# Patient Record
Sex: Female | Born: 2000 | Hispanic: Yes | Smoking: Never smoker
Health system: Southern US, Community
[De-identification: ages and names within clinical notes are randomized; demographics above are authoritative.]

## PROBLEM LIST (undated history)

## (undated) DIAGNOSIS — M549 Dorsalgia, unspecified: Secondary | ICD-10-CM

## (undated) DIAGNOSIS — D649 Anemia, unspecified: Secondary | ICD-10-CM

## (undated) HISTORY — DX: Anemia, unspecified: D64.9

## (undated) HISTORY — DX: Dorsalgia, unspecified: M54.9

---
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
## 2022-01-14 ENCOUNTER — Telehealth: Payer: Self-pay

## 2022-01-14 NOTE — Telephone Encounter (Signed)
TC to patient to schedule MH RV. Patient not seen in [place] since 10/31/21. Unable to LM on phone. TC to emergency contact Evorn Gong who states patient has a new phone number. New number [phone]. RN also asked Cecille Aver to let patient know to please call [facility]. He states he will do that. TC to new patient number 2 x. No VM set up and unable to LM. Patient needs appointment asap.Jenetta Downer, RN

## 2022-01-15 ENCOUNTER — Ambulatory Visit: Payer: Medicaid Other | Admitting: Nurse Practitioner

## 2022-01-15 ENCOUNTER — Telehealth: Payer: Self-pay

## 2022-01-15 ENCOUNTER — Encounter: Payer: Self-pay | Admitting: Nurse Practitioner

## 2022-01-15 VITALS — BP 105/70 | HR 100 | Temp 97.8°F | Wt 118.2 lb

## 2022-01-15 DIAGNOSIS — Z23 Encounter for immunization: Secondary | ICD-10-CM

## 2022-01-15 DIAGNOSIS — D649 Anemia, unspecified: Secondary | ICD-10-CM | POA: Insufficient documentation

## 2022-01-15 DIAGNOSIS — O99343 Other mental disorders complicating pregnancy, third trimester: Secondary | ICD-10-CM

## 2022-01-15 DIAGNOSIS — O99013 Anemia complicating pregnancy, third trimester: Secondary | ICD-10-CM

## 2022-01-15 DIAGNOSIS — O09899 Supervision of other high risk pregnancies, unspecified trimester: Secondary | ICD-10-CM

## 2022-01-15 DIAGNOSIS — F32A Depression, unspecified: Secondary | ICD-10-CM

## 2022-01-15 DIAGNOSIS — Z3482 Encounter for supervision of other normal pregnancy, second trimester: Secondary | ICD-10-CM

## 2022-01-15 DIAGNOSIS — O99019 Anemia complicating pregnancy, unspecified trimester: Secondary | ICD-10-CM | POA: Insufficient documentation

## 2022-01-15 DIAGNOSIS — O99340 Other mental disorders complicating pregnancy, unspecified trimester: Secondary | ICD-10-CM

## 2022-01-15 DIAGNOSIS — Z3483 Encounter for supervision of other normal pregnancy, third trimester: Secondary | ICD-10-CM

## 2022-01-15 DIAGNOSIS — Z2839 Other underimmunization status: Secondary | ICD-10-CM

## 2022-01-15 LAB — HEMOGLOBIN, FINGERSTICK: Hemoglobin: 9.2 g/dL — ABNORMAL LOW (ref 11.1–15.9)

## 2022-01-15 MED ORDER — SM PRENATAL VITAMINS 28-0.8 MG PO TABS: 1.0000 | ORAL_TABLET | Freq: Every day | ORAL | 1 refills | Status: DC

## 2022-01-15 MED ORDER — FERROUS SULFATE 325 (65 FE) MG PO TABS: 325.0000 mg | ORAL_TABLET | Freq: Three times a day (TID) | ORAL | 3 refills | Status: AC

## 2022-01-15 NOTE — Progress Notes (Unsigned)
[place] [facility]  PRENATAL VISIT NOTE  Subjective:  Samantha Reyes is a 21 y.o. G2P0010 at 13w1dbeing seen today for ongoing prenatal care.  She is currently monitored for the following issues for this high-risk pregnancy and has Late prenatal care; Supervision of normal intrauterine pregnancy in multigravida, second trimester; Fetal demise before 20 weeks with retention of placenta 06/15/19 and D&C; Depression affecting pregnancy; Maternal varicella, non-immune; and Anemia in pregnancy on their problem list.  Patient reports no complaints.  Contractions: Not present. Vag. Bleeding: None.  Movement: Present. Denies leaking of fluid/ROM.   The following portions of the patient's history were reviewed and updated as appropriate: allergies, current medications, past family history, past medical history, past social history, past surgical history and problem list. Problem list updated.  Objective:   Vitals:   01/15/22 2121  BP: 105/70  Pulse: 100  Temp: 97.8 F (36.6 C)  Weight: 118 lb 3.2 oz (53.6 kg)    Fetal Status: Fetal Heart Rate (bpm): 145 Fundal Height: 33 cm Movement: Present     General:  Alert, oriented and cooperative. Patient is in no acute distress.  Skin: Skin is warm and dry. No rash noted.   Cardiovascular: Normal heart rate noted  Respiratory: Normal respiratory effort, no problems with respiration noted  Abdomen: Soft, gravid, appropriate for gestational age.  Pain/Pressure: Absent     Pelvic: Cervical exam deferred        Extremities: Normal range of motion.  Edema: None  Mental Status: Normal mood and affect. Normal behavior. Normal judgment and thought content.   Assessment and Plan:  Pregnancy: G2P0010 at 338w1d1. Supervision of normal intrauterine pregnancy in multigravida, second trimester -[age]ear old female in clinic today for prenatal care.  Last prenatal visit was 10/31/21.  Patient states she was unaware of next  appointment and was waiting on a phone call to notify of next appointment.  -28 week labs completed today. -Tdap given today. -Reviewed with patient signs and symptoms of preterm labor. -Kick counts given to patient. -U/S 12/10/21 (2960w3dnormal amniotic fluid, EFW 52%, placenta anterior.  -11 lb 3.2 oz (5.08 kg)  -Patient reports that she had a [facility]ment and notified the nutritionist that she has been drinking ensures to assist with weight gain and due to low appetite.  Per [facility]ations patient can continue with ensures and informed patient that ensures can be supplied through [facility] submitted for ensures.   - Hemoglobin, fingerstick - Glucose, 1 hour gestational - HIV-1/HIV-2 Qualitative RNA - RPR - Tdap vaccine greater than or equal to 7yo IM - Fe+CBC/D/Plt+TIBC+Fer+Retic - Prenatal Vit-Fe Fumarate-FA (SM PRENATAL VITAMINS) 28-0.8 MG TABS; Take 1 tablet by mouth daily.  Dispense: 30 tablet; Refill: 1  2. Anemia during pregnancy in third trimester -Hgb today 9.2.  Patient advised to incorporate Iron rich foods and to take Iron 325 MG PO TID separate from PNV with juice.  -[facility]   - ferrous sulfate 325 (65 FE) MG tablet; Take 1 tablet (325 mg total) by mouth 3 (three) times daily with meals.  Dispense: 100 tablet; Refill: 3  3. Depression affecting pregnancy -Patient admits to being sad at times, denies thoughts of self harm. Declines counseling at this time.   4. Maternal varicella, non-immune -Patient notified of results and given information sheet.  Patient advised that she will need a Varicella vaccine at postpartum.  Term labor symptoms and general obstetric precautions including but not limited to vaginal bleeding, contractions, leaking of fluid and fetal movement were reviewed in detail with the patient. Please refer to After Visit Summary for other counseling recommendations.   Return in about  2 weeks (around 01/29/2022) for Routine prenatal care visit.  Future Appointments  Date Time Provider [place]  01/29/2022 11:00 AM [facility] PROVIDER [facility] None    Gregary Cromer, FNP

## 2022-01-15 NOTE — Telephone Encounter (Signed)
Call to patient to let her know that PNV have been sent to her local pharmacy [facility] at [address]   Also to let patient know that Ensure Rx was placed by A. WHite, FNP and that she was to pick that up at [facility].  Al Decant, RN

## 2022-01-15 NOTE — Telephone Encounter (Signed)
No additional notes

## 2022-01-15 NOTE — Telephone Encounter (Signed)
Call to patient, but no answer and patient does not have VM set up.   Please let patient know PNV can be picked up at her local pharmacy at [facility] in [place]   Also let her know provider placed Rx for Ensure and that she can pick that up anytime at [facility].   Al Decant, RN

## 2022-01-15 NOTE — Telephone Encounter (Signed)
Call to client and appt scheduled this am with arrival time of 1045 (1100 appt cancelled as in labor). Rich Number, RN

## 2022-01-15 NOTE — Telephone Encounter (Signed)
Call to patient to let her know PNV has been sent to

## 2022-01-15 NOTE — Progress Notes (Signed)
Hgb reviewed during clinic visit.3  Per standing order: Dispensed ferrous sulfate '325mg'$  dispensed to patient. Instructed to take one tablet times a day with juice that has Vit C such as orange, apple, or grape juice.   Anemia panel order added.   Anemia in pregnancy added to problem list.   Counsel patient on Fe-rich foods and Anemia in Pregnancy Pamphlet given.   Patient verbalized understanding. Questions answered.   Al Decant, RN

## 2022-01-15 NOTE — Progress Notes (Unsigned)
Patient here for [place] RV at 33w 1d.   PTL handout, kick counts, chicken pox in pregnancy education done today. Patient aware she will receive varivax PP.   1 hr gtt done today a long with other 28 weeks labs. Tdap done today - tolerated well. Updated immunization record handed today.   Al Decant

## 2022-01-16 LAB — FE+CBC/D/PLT+TIBC+FER+RETIC
Basophils Absolute: 0 10*3/uL (ref 0.0–0.2)
Basos: 1 %
EOS (ABSOLUTE): 0.1 10*3/uL (ref 0.0–0.4)
Eos: 1 %
Ferritin: 13 ng/mL — ABNORMAL LOW (ref 15–77)
Hematocrit: 30 % — ABNORMAL LOW (ref 34.0–46.6)
Hemoglobin: 9.1 g/dL — ABNORMAL LOW (ref 11.1–15.9)
Immature Grans (Abs): 0.1 10*3/uL (ref 0.0–0.1)
Immature Granulocytes: 1 %
Iron Saturation: 5 % — CL (ref 15–55)
Iron: 32 ug/dL (ref 27–159)
Lymphocytes Absolute: 1.5 10*3/uL (ref 0.7–3.1)
Lymphs: 24 %
MCH: 25 pg — ABNORMAL LOW (ref 26.6–33.0)
MCHC: 30.3 g/dL — ABNORMAL LOW (ref 31.5–35.7)
MCV: 82 fL (ref 79–97)
Monocytes Absolute: 0.4 10*3/uL (ref 0.1–0.9)
Monocytes: 7 %
Neutrophils Absolute: 4.2 10*3/uL (ref 1.4–7.0)
Neutrophils: 66 %
Platelets: 230 10*3/uL (ref 150–450)
RBC: 3.64 x10E6/uL — ABNORMAL LOW (ref 3.77–5.28)
RDW: 13.6 % (ref 11.7–15.4)
Retic Ct Pct: 2.8 % — ABNORMAL HIGH (ref 0.6–2.6)
Total Iron Binding Capacity: 697 ug/dL (ref 250–450)
UIBC: 665 ug/dL — ABNORMAL HIGH (ref 131–425)
WBC: 6.2 10*3/uL (ref 3.4–10.8)

## 2022-01-16 NOTE — Telephone Encounter (Signed)
Telephone call to patient today regarding information about her PNV and Ensure.  Voicemail not set up.  MyChart message sent to patient today regarding her PNV and Ensure.  Dahlia Bailiff, RN

## 2022-01-17 LAB — HIV-1/HIV-2 QUALITATIVE RNA
HIV-1 RNA, Qualitative: NONREACTIVE
HIV-2 RNA, Qualitative: NONREACTIVE

## 2022-01-17 LAB — RPR: RPR Ser Ql: NONREACTIVE

## 2022-01-17 LAB — GLUCOSE, 1 HOUR GESTATIONAL: Gestational Diabetes Screen: 125 mg/dL (ref 70–139)

## 2022-01-17 NOTE — Telephone Encounter (Signed)
Telephone call to patient today regarding some information patient needs.  Voicemail not set up.  Telephone call to patient contact/Friend Evorn Gong)  [phone].  Cecille Aver will have patient call the maternity nurses at [phone].  Dahlia Bailiff, RN

## 2022-01-17 NOTE — Telephone Encounter (Signed)
Patient sent MyChart message stating she received the message and will pick up both the PNV and Ensure. Dahlia Bailiff, RN

## 2022-01-29 ENCOUNTER — Ambulatory Visit: Payer: Medicaid Other

## 2022-01-29 DIAGNOSIS — Z2839 Other underimmunization status: Secondary | ICD-10-CM

## 2022-01-29 NOTE — Addendum Note (Signed)
Addended by: Cletis Media on: 01/29/2022 12:38 PM   Modules accepted: Orders

## 2022-01-30 ENCOUNTER — Telehealth: Payer: Self-pay

## 2022-01-30 NOTE — Telephone Encounter (Signed)
TC to patient to reschedule missed MH RV yesterday. Unable to LM, no voicemail set up. TC to patient emergency contact, unable to LM, no voicemail set up. Jenetta Downer, RN

## 2022-02-03 NOTE — Telephone Encounter (Signed)
TC to patient to reschedule missed MH RV. Unable to LM, no VM set up.Jenetta Downer, RN

## 2022-02-05 NOTE — Telephone Encounter (Signed)
TC to patient to reschedule missed MH RV on 01/29/22. Patient now scheduled for 02/06/22 at 10:00, counseled to arrive 9:45. Jenetta Downer, RN

## 2022-02-06 ENCOUNTER — Telehealth: Payer: Self-pay

## 2022-02-06 ENCOUNTER — Ambulatory Visit: Payer: Medicaid Other | Admitting: Advanced Practice Midwife

## 2022-02-06 VITALS — BP 105/65 | HR 89 | Temp 97.4°F | Wt 122.6 lb

## 2022-02-06 DIAGNOSIS — F32A Depression, unspecified: Secondary | ICD-10-CM

## 2022-02-06 DIAGNOSIS — O99343 Other mental disorders complicating pregnancy, third trimester: Secondary | ICD-10-CM

## 2022-02-06 DIAGNOSIS — F5089 Other specified eating disorder: Secondary | ICD-10-CM | POA: Insufficient documentation

## 2022-02-06 DIAGNOSIS — Z91199 Patient's noncompliance with other medical treatment and regimen due to unspecified reason: Secondary | ICD-10-CM

## 2022-02-06 DIAGNOSIS — Z3483 Encounter for supervision of other normal pregnancy, third trimester: Secondary | ICD-10-CM

## 2022-02-06 DIAGNOSIS — O99340 Other mental disorders complicating pregnancy, unspecified trimester: Secondary | ICD-10-CM

## 2022-02-06 DIAGNOSIS — O0932 Supervision of pregnancy with insufficient antenatal care, second trimester: Secondary | ICD-10-CM

## 2022-02-06 DIAGNOSIS — O99013 Anemia complicating pregnancy, third trimester: Secondary | ICD-10-CM

## 2022-02-06 DIAGNOSIS — O0930 Supervision of pregnancy with insufficient antenatal care, unspecified trimester: Secondary | ICD-10-CM

## 2022-02-06 DIAGNOSIS — Z3482 Encounter for supervision of other normal pregnancy, second trimester: Secondary | ICD-10-CM

## 2022-02-06 DIAGNOSIS — O021 Missed abortion: Secondary | ICD-10-CM

## 2022-02-06 LAB — HEMOGLOBIN, FINGERSTICK: Hemoglobin: 8.7 g/dL — ABNORMAL LOW (ref 11.1–15.9)

## 2022-02-06 NOTE — Telephone Encounter (Signed)
Called  [place] at [phone] to speak to someone there to have patient's IV infusion scheduled.   Per scheduler, she states that there indeed is on order for an IV infusion, however, they have no availability prior to EDD of 02/28/22. She recommended I call [facility] to see if I can schedule an infusion sooner.   RN then called [facility] and they transferred to [facility] same day admissions to have make an appointment for patient.   [facility] admissions phone number is [phone] and spoke with [place]. Per Elmo Putt in order to have patient scheduled, they need a attending MD, referring doctor and and diagnosis into order to place admission order. Could not go any further with this process.   RN then  again called [facility] at [phone] ext [phone] to speak to an RN. Spoke with Rosemarie Ax, RN to let her know of situation. Per Rosemarie Ax she states she will send a message to provider who spoke with patient and placed IV infusion order, [employer], [place].   Ree Shay phone number [phone] to given [facility] maternity nurse an update.   RN also helped Earlene with sending a mychart message to [employer], AGNP regarding need to have iron IV infusion scheduled before due date of 03/04/22.   Patient aware of all of this and she states she will answer our call when me call her for an update today.   Al Decant, RN

## 2022-02-06 NOTE — Progress Notes (Signed)
[place] [facility]  PRENATAL VISIT NOTE  Subjective:  Samantha Reyes is a 21 y.o. G3P0010 at 88w2dbeing seen today for ongoing prenatal care.  She is currently monitored for the following issues for this low-risk pregnancy and has Late prenatal care 18 2/7; Supervision of normal intrauterine pregnancy in multigravida, second trimester; Fetal demise before 20 weeks with retention of placenta 06/15/19 and D&C; Depression affecting pregnancy; Maternal varicella, non-immune; Anemia in pregnancy; Noncompliance with prenatal care (no care x 11 wks); and Pica ice on their problem list.  Patient reports no complaints.  Contractions: Not present. Vag. Bleeding: None.  Movement: Present. Denies leaking of fluid/ROM.   The following portions of the patient's history were reviewed and updated as appropriate: allergies, current medications, past family history, past medical history, past social history, past surgical history and problem list. Problem list updated.  Objective:   Vitals:   02/06/22 1004  BP: 105/65  Pulse: 89  Temp: (!) 97.4 F (36.3 C)  Weight: 122 lb 9.6 oz (55.6 kg)    Fetal Status: Fetal Heart Rate (bpm): 145 Fundal Height: 37 cm Movement: Present  Presentation: Vertex  General:  Alert, oriented and cooperative. Patient is in no acute distress.  Skin: Skin is warm and dry. No rash noted.   Cardiovascular: Normal heart rate noted  Respiratory: Normal respiratory effort, no problems with respiration noted  Abdomen: Soft, gravid, appropriate for gestational age.  Pain/Pressure: Absent     Pelvic: Cervical exam deferred        Extremities: Normal range of motion.  Edema: None  Mental Status: Normal mood and affect. Normal behavior. Normal judgment and thought content.   Assessment and Plan:  Pregnancy: G3P0010 at 327w2d1. 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 Late prenatal care 18 2/7   3. Anemia during pregnancy in third trimester Ice pica Not taking FeSo4 daily, maybe 4x/wk with oj Hgb=8.7 today [facility] with plan for IV iron infusion 1000 mg x1 now but never scheduled E. Ramos, RN [facility] and next available apt is after pt delivers; she called scheduler and was told pt needs to be admitted in order to receive IV iron soon, but then was told in order to do this she needs an attending order for admission and for infusion. Pt saw a NP at 01/21/22 apt. She left a message for that NP to call her back to discuss  - Hemoglobin, venipuncture  4. Depression affecting pregnancy PHQ-9=6 Declines need for counseling  5. Fetal demise before 20 weeks with retention of placenta 06/15/19 and D&C   6. Noncompliance with prenatal care (no care x 11 wks)   7. Pica ice Counseled not to eat   Preterm labor symptoms and general obstetric precautions including but not limited to vaginal bleeding, contractions, leaking of fluid and fetal movement were reviewed in detail with the patient. Please refer to After Visit Summary for other counseling recommendations.  Return in about 1 week (around 02/13/2022) for routine PNC.  No future appointments.  ElHerbie SaxonCNM

## 2022-02-06 NOTE — Telephone Encounter (Signed)
Received a call from [facility] and spoke with Rubin Payor, [place]. She states the FNP, Merla Riches, that patient saw on 01/21/22 is out of town.   Per Cassie, she states that as of 2-3 months ago [facility] admissions no longer does IN infusions. She states she will add an expedient order to the IV iron infusion to hopefully see her before due date of 02/28/22. She states [facility] will give patient a call with an appointment.   Patient was made aware of this and encouraged to pick up call from [facility] to have her scheduled.   Al Decant, RN

## 2022-02-06 NOTE — Progress Notes (Signed)
Hgb reviewed with provider during clinic visit. Patient educated on the importance of taking iron TID daily, that her hgb is now even lower.   Patient aware there is a need of IV iron infusion and in the process of getting that scheduled with [facility] ASAP (see previous note).   Al Decant, RN

## 2022-02-10 LAB — CULTURE, BETA STREP (GROUP B ONLY): Strep Gp B Culture: NEGATIVE

## 2022-02-10 LAB — CHLAMYDIA/GC NAA, CONFIRMATION
Chlamydia trachomatis, NAA: NEGATIVE
Neisseria gonorrhoeae, NAA: NEGATIVE

## 2022-02-14 ENCOUNTER — Ambulatory Visit: Payer: Medicaid Other | Admitting: Nurse Practitioner

## 2022-02-14 ENCOUNTER — Encounter: Payer: Self-pay | Admitting: Nurse Practitioner

## 2022-02-14 VITALS — BP 98/67 | HR 105 | Temp 97.5°F | Wt 122.8 lb

## 2022-02-14 DIAGNOSIS — F32A Depression, unspecified: Secondary | ICD-10-CM

## 2022-02-14 DIAGNOSIS — F5089 Other specified eating disorder: Secondary | ICD-10-CM

## 2022-02-14 DIAGNOSIS — Z3483 Encounter for supervision of other normal pregnancy, third trimester: Secondary | ICD-10-CM

## 2022-02-14 DIAGNOSIS — O99340 Other mental disorders complicating pregnancy, unspecified trimester: Secondary | ICD-10-CM

## 2022-02-14 DIAGNOSIS — O99019 Anemia complicating pregnancy, unspecified trimester: Secondary | ICD-10-CM

## 2022-02-14 NOTE — Progress Notes (Signed)
[place] [facility]  PRENATAL VISIT NOTE  Subjective:  Samantha Reyes is a 21 y.o. G3P0010 at 35w3dbeing seen today for ongoing prenatal care.  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  Patient reports no complaints.  Contractions: Not present. Vag. Bleeding: None.  Movement: Present. Denies leaking of fluid/ROM.   The following portions of the patient's history were reviewed and updated as appropriate: allergies, current medications, past family history, past medical history, past social history, past surgical history and problem list. Problem list updated.  Objective:   Vitals:   02/14/22 1000  BP: 98/67  Pulse: (!) 105  Temp: (!) 97.5 F (36.4 C)  Weight: 122 lb 12.8 oz (55.7 kg)    Fetal Status: Fetal Heart Rate (bpm): 160 Fundal Height: 37 cm Movement: Present  Presentation: Vertex  General:  Alert, oriented and cooperative. Patient is in no acute distress.  Skin: Skin is warm and dry. No rash noted.   Cardiovascular: Normal heart rate noted  Respiratory: Normal respiratory effort, no problems with respiration noted  Abdomen: Soft, gravid, appropriate for gestational age.  Pain/Pressure: Absent     Pelvic: Cervical exam deferred        Extremities: Normal range of motion.  Edema: None  Mental Status: Normal mood and affect. Normal behavior. Normal judgment and thought content.   Assessment and Plan:  Pregnancy: G3P0010 at 371w3d1. Supervision of normal intrauterine pregnancy in multigravida in third trimester -[age]ear old female in clinic  today for prenatal care. -Patient taking PNV daily. -ROS reviewed, no complaints. -15 lb 12.8 oz (7.167 kg)   2. Depression affecting pregnancy -Patient denies signs and symptoms of depression.  Overall doing well.    3. Antepartum anemia -Last Hgb 8.7 on 02/06/22.  Patient had an appointment with Hematology on 02/10/22.  Patient given Iron infusion at that visit, tolerated well.  Will check Iron level in 2 weeks.  Encouraged to continue to take iron daily and to incorporate iron rich foods.  Also provided education surrounding the impact that anemia has on pregnancy and delivery.   4. Pica ice -Denies eating ice.    Term labor symptoms and general obstetric precautions including but not limited to vaginal bleeding, contractions, leaking of fluid and fetal movement were reviewed in detail with the patient. Please refer to After Visit Summary for other counseling recommendations.   Return in about 1 week (around 02/21/2022) for Routine prenatal care visit.  Future Appointments  Date Time Provider [place]/27/2023  2:20 PM AC-MH PROVIDER AC-MAT None    AyGregary CromerFNP

## 2022-02-14 NOTE — Progress Notes (Signed)
Patient here for MH RV at 37 3/7. Had [facility] iron infusion on 02/10/2022. States she was counseled at [facility] video appointment to take iron once daily as per patient, iron makes her nauseous.Jenetta Downer, RN

## 2022-02-21 ENCOUNTER — Ambulatory Visit: Payer: Medicaid Other | Admitting: Advanced Practice Midwife

## 2022-02-21 VITALS — BP 103/72 | HR 96 | Temp 97.4°F | Wt 124.0 lb

## 2022-02-21 DIAGNOSIS — Z3483 Encounter for supervision of other normal pregnancy, third trimester: Secondary | ICD-10-CM

## 2022-02-21 DIAGNOSIS — O99343 Other mental disorders complicating pregnancy, third trimester: Secondary | ICD-10-CM

## 2022-02-21 DIAGNOSIS — O99013 Anemia complicating pregnancy, third trimester: Secondary | ICD-10-CM

## 2022-02-21 DIAGNOSIS — O99019 Anemia complicating pregnancy, unspecified trimester: Secondary | ICD-10-CM

## 2022-02-21 DIAGNOSIS — Z3482 Encounter for supervision of other normal pregnancy, second trimester: Secondary | ICD-10-CM

## 2022-02-21 DIAGNOSIS — F32A Depression, unspecified: Secondary | ICD-10-CM

## 2022-02-21 DIAGNOSIS — O0930 Supervision of pregnancy with insufficient antenatal care, unspecified trimester: Secondary | ICD-10-CM

## 2022-02-21 DIAGNOSIS — O99340 Other mental disorders complicating pregnancy, unspecified trimester: Secondary | ICD-10-CM

## 2022-02-21 DIAGNOSIS — F5089 Other specified eating disorder: Secondary | ICD-10-CM

## 2022-02-21 DIAGNOSIS — O0933 Supervision of pregnancy with insufficient antenatal care, third trimester: Secondary | ICD-10-CM

## 2022-02-21 NOTE — Progress Notes (Signed)
[place] [facility]  PRENATAL VISIT NOTE  Subjective:  Samantha Reyes is a 21 y.o. G3P0010 at 57w3dbeing seen today for ongoing prenatal care.  She is currently monitored for the following issues for this low-risk pregnancy and has Late prenatal care 18 2/7; Supervision of normal intrauterine pregnancy in multigravida, second trimester; Fetal demise before 20 weeks with retention of placenta 06/15/19 and D&C; Depression affecting pregnancy; Maternal varicella, non-immune; Anemia in pregnancy; Noncompliance with prenatal care (no care x 11 wks); and Pica ice on their problem list.  Patient reports no complaints.  Contractions: Not present. Vag. Bleeding: None.  Movement: Present. Denies leaking of fluid/ROM.   The following portions of the patient's history were reviewed and updated as appropriate: allergies, current medications, past family history, past medical history, past social history, past surgical history and problem list. Problem list updated.  Objective:   Vitals:   02/21/22 1446  BP: 103/72  Pulse: 96  Temp: (!) 97.4 F (36.3 C)  Weight: 124 lb (56.2 kg)    Fetal Status: Fetal Heart Rate (bpm): 140 Fundal Height: 39 cm Movement: Present  Presentation: Vertex  General:  Alert, oriented and cooperative. Patient is in no acute distress.  Skin: Skin is warm and dry. No rash noted.   Cardiovascular: Normal heart rate noted  Respiratory: Normal respiratory effort, no problems with respiration noted  Abdomen: Soft, gravid, appropriate for gestational age.  Pain/Pressure: Absent     Pelvic: Cervical exam deferred        Extremities: Normal range of motion.  Edema: None  Mental Status: Normal mood and affect. Normal behavior. Normal judgment and thought content.   Assessment and Plan:  Pregnancy: G3P0010 at 325w3d1. Supervision of normal intrauterine pregnancy in multigravida, second trimester 17 lb (7.711 kg) Here with her mom Has  car seat, crib Knows when to go to L&D Not exercising--encouraged to do so 4x/wk Last day of work was 02/14/22  2. Antepartum anemia Taking FeSo4 I daily with water--counseled to take with oj Not eating ice anymore To check Hgb next week  3. Late prenatal care 18 2/7   4. Pica ice Denies eating now that not working  5. Depression affecting pregnancy Denies need for counseling    Preterm labor symptoms and general obstetric precautions including but not limited to vaginal bleeding, contractions, leaking of fluid and fetal movement were reviewed in detail with the patient. Please refer to After Visit Summary for other counseling recommendations.  Return in about 1 week (around 02/28/2022) for routine PNC.  No future appointments.  ElHerbie SaxonCNM

## 2022-02-21 NOTE — Progress Notes (Addendum)
Taking PNV in am and iron tablet in afternoon. Encouraged to take iron tablet with Vitamin C beverage. Kept 02/10/2322 iron infusion appt at [facility]. Verified has [facility] contact card. Counseled on importance of selecting a pediatrician - verified has resource list. Rich Number, RN 1 week [facility] RV appt scheduled for Thursday as an overbook (EGA today = 38 3/7) and reminder card given to client. Rich Number, RN

## 2022-02-27 ENCOUNTER — Encounter: Payer: Self-pay | Admitting: Physician Assistant

## 2022-02-27 ENCOUNTER — Ambulatory Visit: Payer: Medicaid Other | Admitting: Physician Assistant

## 2022-02-27 VITALS — BP 100/65 | HR 102 | Temp 97.5°F | Wt 125.6 lb

## 2022-02-27 DIAGNOSIS — O99019 Anemia complicating pregnancy, unspecified trimester: Secondary | ICD-10-CM

## 2022-02-27 DIAGNOSIS — O99340 Other mental disorders complicating pregnancy, unspecified trimester: Secondary | ICD-10-CM

## 2022-02-27 DIAGNOSIS — F32A Depression, unspecified: Secondary | ICD-10-CM

## 2022-02-27 DIAGNOSIS — Z3483 Encounter for supervision of other normal pregnancy, third trimester: Secondary | ICD-10-CM

## 2022-02-27 LAB — HEMOGLOBIN, FINGERSTICK: Hemoglobin: 10.7 g/dL — ABNORMAL LOW (ref 11.1–15.9)

## 2022-02-27 NOTE — Progress Notes (Signed)
Patient here for MH RV at [redacted]w[redacted]d   Hgb reviewed during clinic visit. Patient to continue taking iron as prescribed.   Made patient an appointment to return tomorrow at 2pm for membrane sweep only per provider request.   EAl Decant RN

## 2022-02-27 NOTE — Addendum Note (Signed)
Addended by: Cletis Media on: 02/27/2022 11:59 AM   Modules accepted: Orders

## 2022-02-27 NOTE — Progress Notes (Signed)
PRENATAL VISIT NOTE  Subjective:  Samantha Reyes is a 21 y.o. G3P0010 at 55w2dbeing seen today for ongoing prenatal care.  She is currently monitored for the following issues for this high-risk pregnancy and has Late prenatal care 18 2/7; Supervision of normal intrauterine pregnancy in multigravida in third trimester; Fetal demise before 20 weeks with retention of placenta 06/15/19 and D&C; Depression affecting pregnancy; Maternal varicella, non-immune; Anemia in pregnancy; Noncompliance with prenatal care (no care x 11 wks); and Pica ice on their problem list.  Patient reports  she lost her mucus plug several days ago .  Contractions: Not present. Vag. Bleeding: None.  Movement: Present. Requests membrane sweep. Had L&D eval 02/25/22 after several hours of contractions, was told she was 3.5cm dilated. Contractions discontinued and she was discharged home.Denies leaking of fluid/ROM.   The following portions of the patient's history were reviewed and updated as appropriate: allergies, current medications, past family history, past medical history, past social history, past surgical history and problem list. Problem list updated.  Objective:   Vitals:   02/27/22 1407  BP: 100/65  Pulse: (!) 102  Temp: (!) 97.5 F (36.4 C)  Weight: 125 lb 9.6 oz (57 kg)    Fetal Status: Fetal Heart Rate (bpm): 152 Fundal Height: 39 cm Movement: Present  Presentation: Vertex  General:  Alert, oriented and cooperative. Patient is in no acute distress.  Skin: Skin is warm and dry. No rash noted.   Cardiovascular: Normal heart rate noted  Respiratory: Normal respiratory effort, no problems with respiration noted  Abdomen: Soft, gravid, appropriate for gestational age.  Pain/Pressure: Absent     Pelvic: Cervical exam performed Dilation: 3 Effacement (%): 50 Station: -3  Extremities: Normal range of motion.  Edema: None  Mental Status: Normal mood and affect. Normal behavior. Normal judgment and thought  content.   Assessment and Plan:  Pregnancy: G3P0010 at 348w2d1. Supervision of normal intrauterine pregnancy in multigravida in third trimester Doing well. Membranes swept at pt request, unable to reach left side. Pt tolerated procedure well .To return for repeat membrane sweeping tomorrow. Discussed option for IOL at 41-42 wks - pt declines. Continue weekly visits. - Hemoglobin, fingerstick  2. Antepartum anemia Completed iron infusion. Anemia improved at 10.7 g/dL today. Continue oral iron. - Hemoglobin, fingerstick  3. Depression affecting pregnancy Denies depressive sx at present, will continue to monitor.   Term labor symptoms and general obstetric precautions including but not limited to vaginal bleeding, contractions, leaking of fluid and fetal movement were reviewed in detail with the patient. Please refer to After Visit Summary for other counseling recommendations.  Return in about 1 week (around 03/06/2022) for Routine prenatal care.  Future Appointments  Date Time Provider [place]11/06/2021  2:00 PM AC-MH PROVIDER AC-MAT None  03/06/2022  1:40 PM AC-MH PROVIDER AC-MAT None    AnLora HavensPA-C

## 2022-02-28 ENCOUNTER — Ambulatory Visit: Payer: Medicaid Other | Admitting: Advanced Practice Midwife

## 2022-02-28 VITALS — BP 114/74 | HR 92 | Temp 98.6°F | Wt 125.8 lb

## 2022-02-28 DIAGNOSIS — F32A Depression, unspecified: Secondary | ICD-10-CM

## 2022-02-28 DIAGNOSIS — O99019 Anemia complicating pregnancy, unspecified trimester: Secondary | ICD-10-CM

## 2022-02-28 DIAGNOSIS — F5089 Other specified eating disorder: Secondary | ICD-10-CM

## 2022-02-28 DIAGNOSIS — O0930 Supervision of pregnancy with insufficient antenatal care, unspecified trimester: Secondary | ICD-10-CM

## 2022-02-28 DIAGNOSIS — Z91199 Patient's noncompliance with other medical treatment and regimen due to unspecified reason: Secondary | ICD-10-CM

## 2022-02-28 DIAGNOSIS — O99340 Other mental disorders complicating pregnancy, unspecified trimester: Secondary | ICD-10-CM

## 2022-02-28 DIAGNOSIS — Z3483 Encounter for supervision of other normal pregnancy, third trimester: Secondary | ICD-10-CM

## 2022-02-28 DIAGNOSIS — O021 Missed abortion: Secondary | ICD-10-CM

## 2022-02-28 NOTE — Progress Notes (Signed)
[place] [facility]  PRENATAL VISIT NOTE  Subjective:  Samantha Reyes is a 21 y.o. G3P0010 at 44w3dbeing seen today for ongoing prenatal care.  She is currently monitored for the following issues for this high-risk pregnancy and has Late prenatal care 18 2/7; Supervision of normal intrauterine pregnancy in multigravida in third trimester; Fetal demise before 20 weeks with retention of placenta 06/15/19 and D&C; Depression affecting pregnancy; Maternal varicella, non-immune; Anemia in pregnancy; Noncompliance with prenatal care (no care x 11 wks); and Pica ice on their problem list.  Patient reports no complaints.  Contractions: Not present. Vag. Bleeding: None.  Movement: Present. Denies leaking of fluid/ROM.   The following portions of the patient's history were reviewed and updated as appropriate: allergies, current medications, past family history, past medical history, past social history, past surgical history and problem list. Problem list updated.  Objective:   Vitals:   02/28/22 1403  BP: 114/74  Pulse: 92  Temp: 98.6 F (37 C)  Weight: 125 lb 12.8 oz (57.1 kg)    Fetal Status: Fetal Heart Rate (bpm): 150   Movement: Present  Presentation: Vertex  General:  Alert, oriented and cooperative. Patient is in no acute distress.  Skin: Skin is warm and dry. No rash noted.   Cardiovascular: Normal heart rate noted  Respiratory: Normal respiratory effort, no problems with respiration noted  Abdomen: Soft, gravid, appropriate for gestational age.  Pain/Pressure: Absent     Pelvic: Cervical exam performed Dilation: 3.5 Effacement (%): 50 Station: -3  Extremities: Normal range of motion.  Edema: None  Mental Status: Normal mood and affect. Normal behavior. Normal judgment and thought content.   Assessment and Plan:  Pregnancy: G3P0010 at 337w3d1. Supervision of normal intrauterine pregnancy in multigravida in third trimester Here for membrane  sweeping today--done easily Has car seat, has [place] Knows when to go to L&D Not working Here with sister in law At yesterday's apt refused IOL; discussed with pt and she now chooses 03/12/22 as IOL date  2. Noncompliance with prenatal care (no care x 11 wks)   3. Antepartum anemia Taking FeSo4 I daily with Ensure--to take with oj  4. Late prenatal care 18 2/7   5. Pica ice Pt denies eating  6. Depression affecting pregnancy Denies need for counseling  7. Fetal demise before 20 weeks with retention of placenta 06/15/19 and D&C    Term labor symptoms and general obstetric precautions including but not limited to vaginal bleeding, contractions, leaking of fluid and fetal movement were reviewed in detail with the patient. Please refer to After Visit Summary for other counseling recommendations.  Return in about 1 week (around 03/07/2022) for routine PNC.  Future Appointments  Date Time Provider [place]11/12/2021  1:40 PM AC-MH PROVIDER AC-MAT None    ElHerbie SaxonCNM

## 2022-02-28 NOTE — Progress Notes (Signed)
Here for membrane sweep only. BThiele RN

## 2022-03-05 ENCOUNTER — Telehealth: Payer: Self-pay

## 2022-03-05 NOTE — Telephone Encounter (Signed)
Called patients phone to give date and time of IOL. Phone does not take voice mail. BThiele RN

## 2022-03-06 ENCOUNTER — Ambulatory Visit: Payer: Medicaid Other | Admitting: Physician Assistant

## 2022-03-06 ENCOUNTER — Encounter: Payer: Self-pay | Admitting: Physician Assistant

## 2022-03-06 VITALS — BP 105/66 | HR 96 | Temp 97.2°F | Wt 126.8 lb

## 2022-03-06 DIAGNOSIS — O480 Post-term pregnancy: Secondary | ICD-10-CM

## 2022-03-06 DIAGNOSIS — Z3483 Encounter for supervision of other normal pregnancy, third trimester: Secondary | ICD-10-CM

## 2022-03-06 DIAGNOSIS — O99013 Anemia complicating pregnancy, third trimester: Secondary | ICD-10-CM

## 2022-03-06 NOTE — Progress Notes (Signed)
[place] [facility]  PRENATAL VISIT NOTE  Subjective:  Samantha Reyes is a 21 y.o. G3P0010 at 78w2dbeing seen today for ongoing prenatal care.  She is currently monitored for the following issues for this low-risk pregnancy and has Late prenatal care 18 2/7; Supervision of normal intrauterine pregnancy in multigravida in third trimester; Fetal demise before 20 weeks with retention of placenta 06/15/19 and D&C; Depression affecting pregnancy; Maternal varicella, non-immune; Anemia in pregnancy; Noncompliance with prenatal care (no care x 11 wks); Pica ice; and Post term pregnancy over 40 weeks on their problem list.  Patient reports  irregular Braxton-Hicks type contractions .  Contractions: Irritability. Vag. Bleeding: None.  Movement: Present. Denies leaking of fluid/ROM.   The following portions of the patient's history were reviewed and updated as appropriate: allergies, current medications, past family history, past medical history, past social history, past surgical history and problem list. Problem list updated.  Objective:   Vitals:   03/06/22 1332 03/06/22 1346  BP: 105/66 105/66  Pulse: 96 96  Temp: (!) 97.2 F (36.2 C) (!) 97.2 F (36.2 C)  Weight: 126 lb 12.8 oz (57.5 kg) 126 lb 12.8 oz (57.5 kg)    Fetal Status: Fetal Heart Rate (bpm): 152 Fundal Height: 39 cm Movement: Present  Presentation: Vertex  General:  Alert, oriented and cooperative. Patient is in no acute distress.  Skin: Skin is warm and dry. No rash noted.   Cardiovascular: Normal heart rate noted  Respiratory: Normal respiratory effort, no problems with respiration noted  Abdomen: Soft, gravid, appropriate for gestational age.  Pain/Pressure: Present     Pelvic: Cervical exam deferred        Extremities: Normal range of motion.  Edema: None  Mental Status: Normal mood and affect. Normal behavior. Normal judgment and thought content.   Assessment and Plan:  Pregnancy:  G3P0010 at 435w2d1. Supervision of normal intrauterine pregnancy in multigravida in third trimester Feels ready for baby, no concerns today.  2. Post term pregnancy over 40 weeks If not delivered, enc to keep NST as sched here 03/11/22 - anticipatory guidance re: procedure given[facility]  3. Anemia during pregnancy in third trimester Continue oral iron supplement.   Term labor symptoms and general obstetric precautions including but not limited to vaginal bleeding, contractions, leaking of fluid and fetal movement were reviewed in detail with the patient. Please refer to After Visit Summary for other counseling recommendations.  Return for 6 wk postpartum.  Future Appointments  Date Time Provider [place]11/14/2023  1:20 PM AC-MH PROVIDER AC-MAT None    AnLora HavensPA-C

## 2022-03-06 NOTE — Telephone Encounter (Signed)
Client aware as kept [facility] RV appt today. Rich Number, RN

## 2022-03-06 NOTE — Progress Notes (Signed)
Pt made aware of IOL appointment on 11/15 and [facility] to call on 11/14 between 11am to 2pm. NST to be scheduled on 11/14 at [place], RN

## 2022-03-10 DIAGNOSIS — Z3483 Encounter for supervision of other normal pregnancy, third trimester: Secondary | ICD-10-CM | POA: Diagnosis not present

## 2022-03-11 ENCOUNTER — Other Ambulatory Visit: Payer: Medicaid Other

## 2022-04-08 ENCOUNTER — Encounter: Payer: Self-pay | Admitting: Emergency Medicine

## 2022-04-08 ENCOUNTER — Emergency Department
Admission: EM | Admit: 2022-04-08 | Discharge: 2022-04-08 | Discharge status: Against Medical Advice | Payer: Medicaid Other | Attending: Emergency Medicine | Admitting: Emergency Medicine

## 2022-04-08 ENCOUNTER — Emergency Department: Payer: Medicaid Other

## 2022-04-08 DIAGNOSIS — Z5321 Procedure and treatment not carried out due to patient leaving prior to being seen by health care provider: Secondary | ICD-10-CM | POA: Diagnosis not present

## 2022-04-08 DIAGNOSIS — R072 Precordial pain: Secondary | ICD-10-CM | POA: Insufficient documentation

## 2022-04-08 DIAGNOSIS — R110 Nausea: Secondary | ICD-10-CM | POA: Insufficient documentation

## 2022-04-08 LAB — BASIC METABOLIC PANEL
Anion gap: 7 (ref 5–15)
BUN: 11 mg/dL (ref 6–20)
CO2: 29 mmol/L (ref 22–32)
Calcium: 9.3 mg/dL (ref 8.9–10.3)
Chloride: 105 mmol/L (ref 98–111)
Creatinine, Ser: 0.51 mg/dL (ref 0.44–1.00)
GFR, Estimated: >60 mL/min (ref >60)
Glucose, Bld: 97 mg/dL (ref 70–99)
Potassium: 3.4 mmol/L — ABNORMAL LOW (ref 3.5–5.1)
Sodium: 141 mmol/L (ref 135–145)

## 2022-04-08 LAB — CBC
HCT: 36.1 % (ref 36.0–46.0)
Hemoglobin: 11.1 g/dL — ABNORMAL LOW (ref 12.0–15.0)
MCH: 26.1 pg (ref 26.0–34.0)
MCHC: 30.7 g/dL (ref 30.0–36.0)
MCV: 84.7 fL (ref 80.0–100.0)
Platelets: 238 10*3/uL (ref 150–400)
RBC: 4.26 MIL/uL (ref 3.87–5.11)
RDW: 19.3 % — ABNORMAL HIGH (ref 11.5–15.5)
WBC: 7.2 10*3/uL (ref 4.0–10.5)
nRBC: 0 % (ref 0.0–0.2)

## 2022-04-08 LAB — TROPONIN I (HIGH SENSITIVITY)
Troponin I (High Sensitivity): <2 ng/L (ref <18)
Troponin I (High Sensitivity): <2 ng/L (ref <18)

## 2022-04-08 NOTE — ED Triage Notes (Signed)
Pt presents via POV with complaints of midsternal CP with associated nausea that started last night. Pt describes the pain as "sharp" pain. She notes taking 2 benadryl PTA, she notes consuming some seafood (allergen) and she believed this was a sx. Airway patent - respirations equal and unlabored. Denies vomiting, diarrhea, SOB.

## 2022-05-30 ENCOUNTER — Telehealth: Payer: Self-pay | Admitting: Family Medicine

## 2022-05-30 NOTE — Telephone Encounter (Signed)
Left voicemail for patient to call the appointment line to schedule her postpartum visit.

## 2022-08-17 IMAGING — US US OB COMP LESS 14 WK
1 series · 13 of 28 positions shown · non-contrast
Comparison: None.

CLINICAL DATA: Pregnant patient. Abdominal pain. Beta HCG level is
128,668

EXAM:
OBSTETRIC <14 WK US
US DOPPLER ULTRASOUND OF OVARIES
TECHNIQUE: Both transabdominal and transvaginal ultrasound examinations were
performed for complete evaluation of the gestation as well as the
maternal uterus, adnexal regions, and pelvic cul-de-sac.
Color and duplex Doppler ultrasound was utilized to evaluate blood
flow to the ovaries.

[Series 1: us ob comp less 14 wks · 13 of 90 slices shown]
[im 4/90]
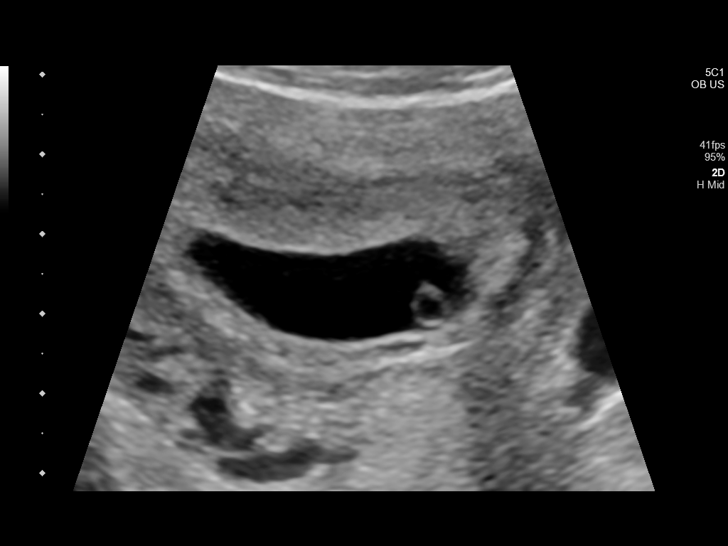
[im 10/90]
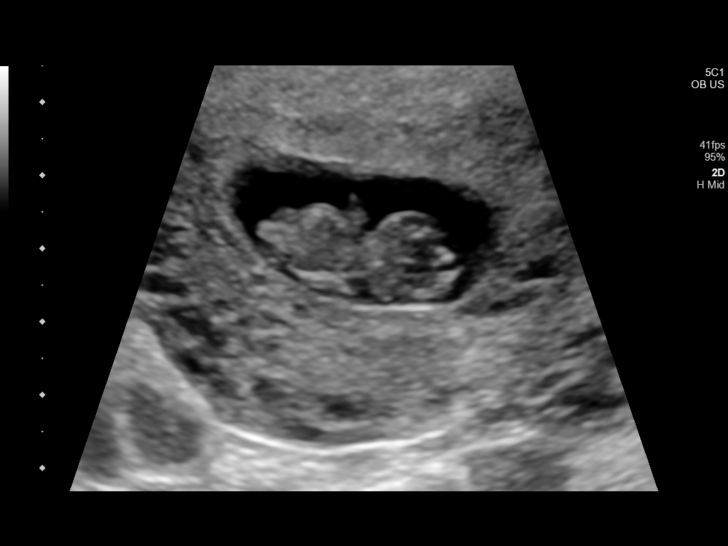
[im 17/90]
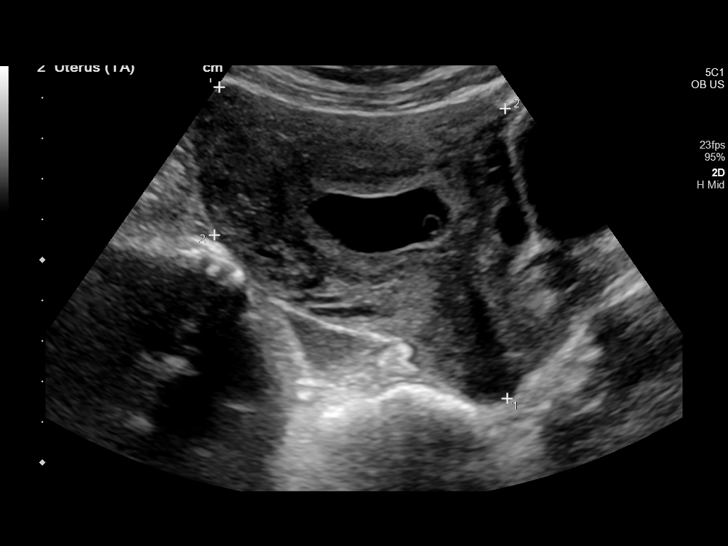
[im 24/90]
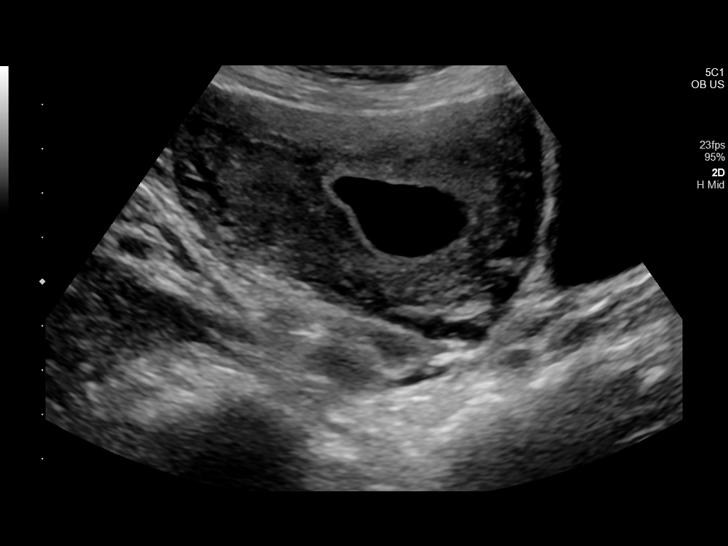
[im 30/90]
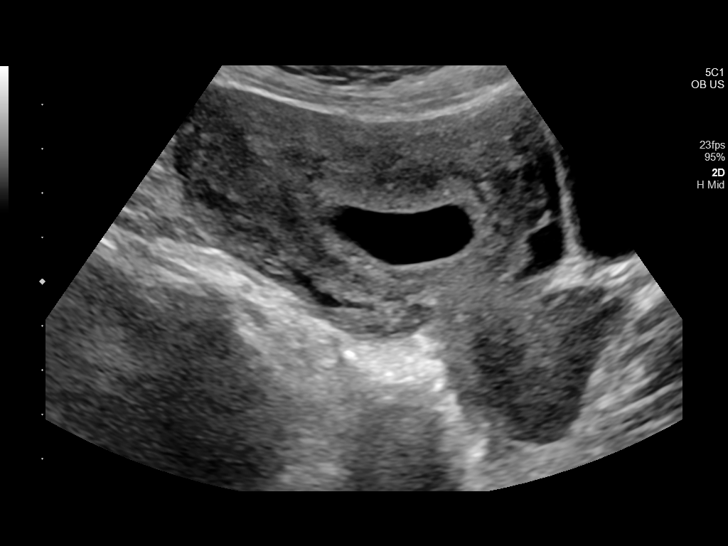
[im 37/90]
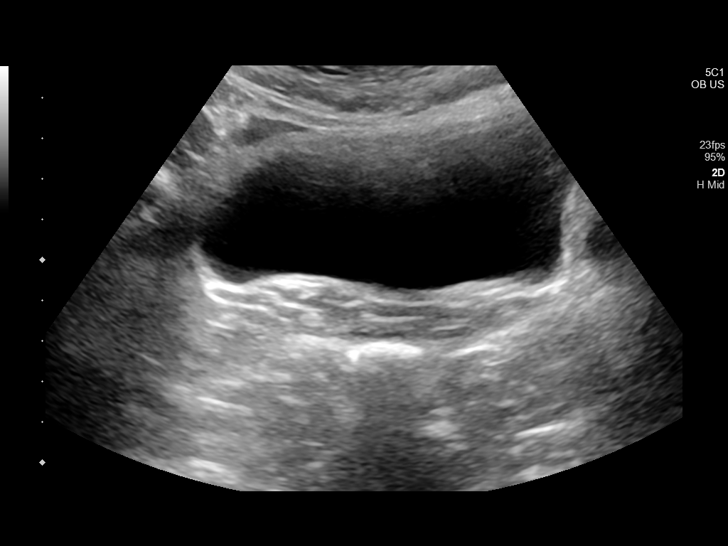
[im 47/90]
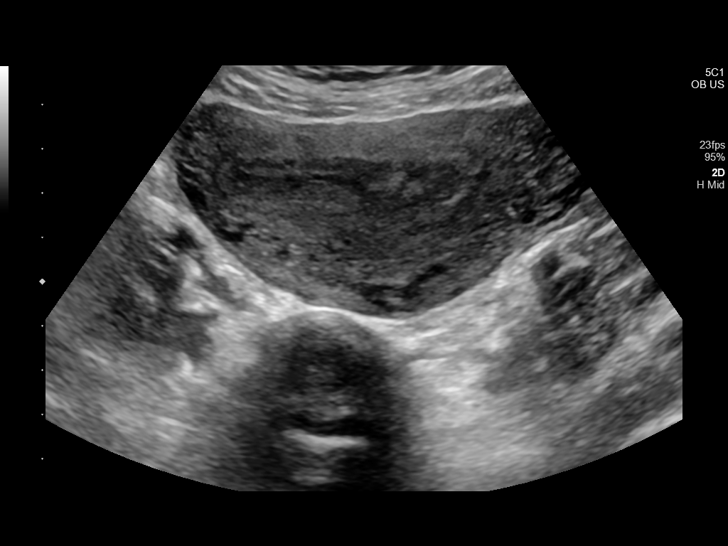
[im 53/90]
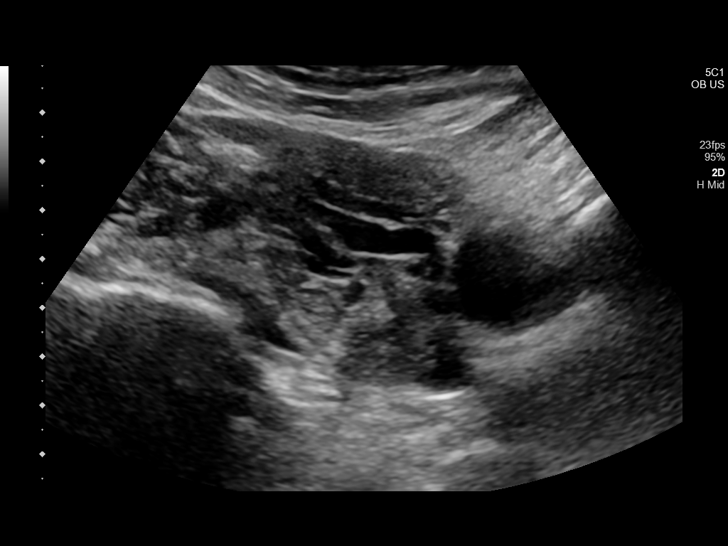
[im 60/90]
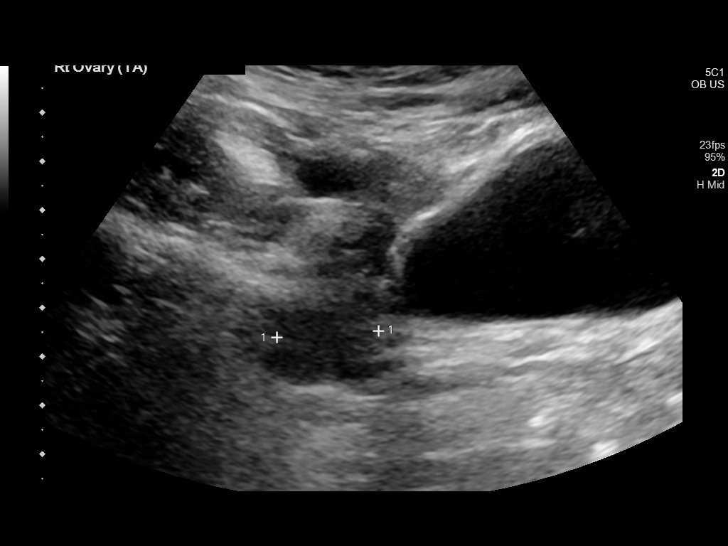
[im 66/90]
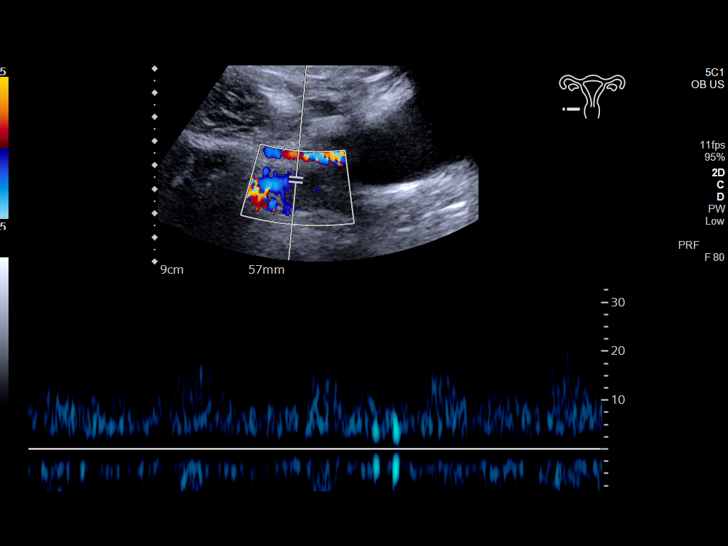
[im 73/90]
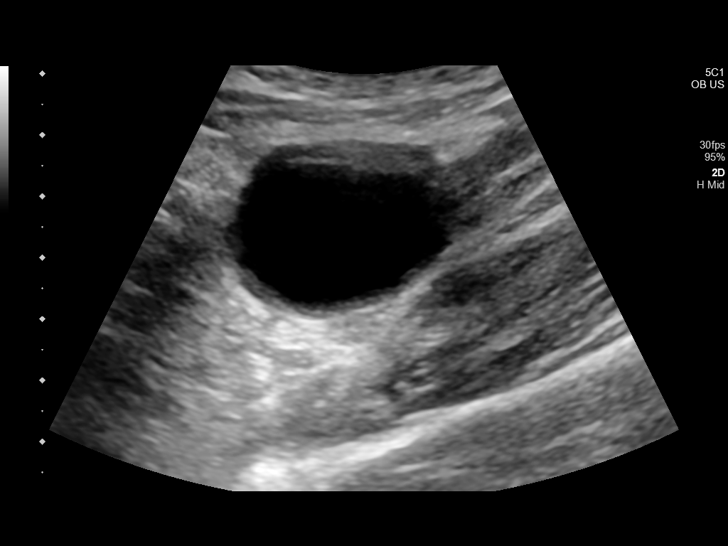
[im 80/90]
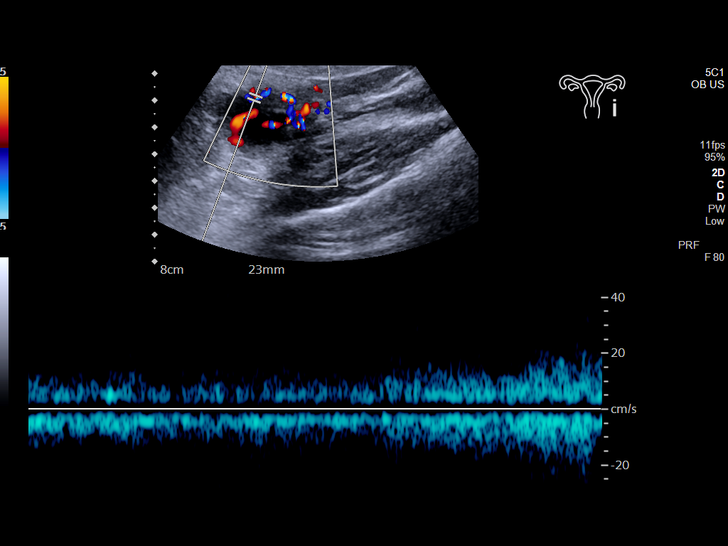
[im 86/90]
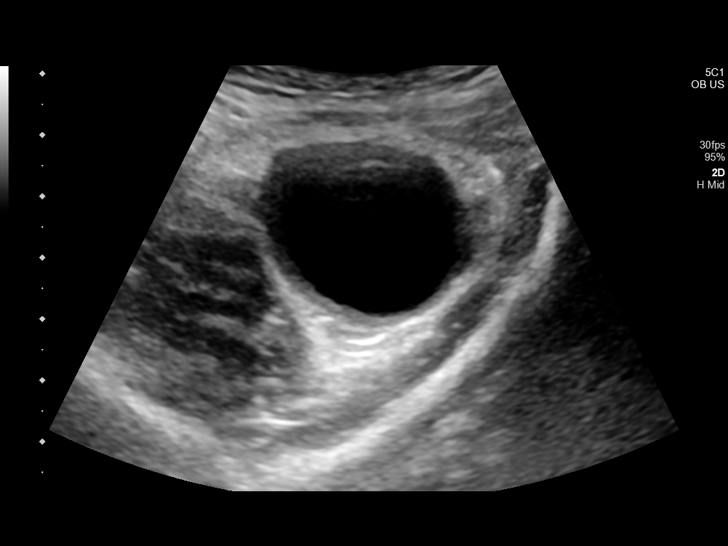

[13 of 28 positions shown; findings below may reference images not displayed]

FINDINGS: Intrauterine gestational sac: Single

Yolk sac:  Visualized.

Embryo:  Visualized.

Cardiac Activity: Visualized.

Heart Rate: 169 bpm

CRL:   28.2 mm   9 w 5 d                  US EDC: 03/04/2022

Subchorionic hemorrhage:  None visualized.

Maternal uterus/adnexae: Unremarkable uterus. Simple appearing right
ovarian cyst measuring 3.4 cm. Ovaries otherwise unremarkable.
Normal ovarian blood flow. No adnexal masses. No pelvic free fluid.

Pulsed Doppler evaluation of both ovaries demonstrates normal
appearing low-resistance arterial and venous waveforms.
IMPRESSION: 1. Single live intrauterine pregnancy with a measured gestational
age of 9 weeks and 5 days.
2. No pregnancy complication.  No acute findings.
3. Incidental 3.4 cm simple appearing right ovarian cyst. No follow
up imaging recommended. Note: This recommendation does not apply to
premenarchal patients or to those with increased risk (genetic,
family history, elevated tumor markers or other high-risk factors)
of ovarian cancer. Reference: Radiology [DATE]):[id number].

## 2023-01-22 ENCOUNTER — Emergency Department
Admission: EM | Admit: 2023-01-22 | Discharge: 2023-01-22 | Disposition: A | Discharge status: Home / Self Care | Payer: Medicaid Other | Attending: Emergency Medicine | Admitting: Emergency Medicine

## 2023-01-22 ENCOUNTER — Other Ambulatory Visit: Payer: Self-pay

## 2023-01-22 ENCOUNTER — Encounter: Payer: Self-pay | Admitting: Emergency Medicine

## 2023-01-22 DIAGNOSIS — R319 Hematuria, unspecified: Secondary | ICD-10-CM | POA: Diagnosis present

## 2023-01-22 DIAGNOSIS — N390 Urinary tract infection, site not specified: Secondary | ICD-10-CM | POA: Insufficient documentation

## 2023-01-22 DIAGNOSIS — B9689 Other specified bacterial agents as the cause of diseases classified elsewhere: Secondary | ICD-10-CM | POA: Insufficient documentation

## 2023-01-22 LAB — COMPREHENSIVE METABOLIC PANEL
ALT: 12 U/L (ref 0–44)
AST: 19 U/L (ref 15–41)
Albumin: 4 g/dL (ref 3.5–5.0)
Alkaline Phosphatase: 69 U/L (ref 38–126)
Anion gap: 11 (ref 5–15)
BUN: 8 mg/dL (ref 6–20)
CO2: 23 mmol/L (ref 22–32)
Calcium: 9.1 mg/dL (ref 8.9–10.3)
Chloride: 103 mmol/L (ref 98–111)
Creatinine, Ser: 0.43 mg/dL — ABNORMAL LOW (ref 0.44–1.00)
GFR, Estimated: >60 mL/min (ref >60)
Glucose, Bld: 95 mg/dL (ref 70–99)
Potassium: 4.1 mmol/L (ref 3.5–5.1)
Sodium: 137 mmol/L (ref 135–145)
Total Bilirubin: 1.1 mg/dL (ref 0.3–1.2)
Total Protein: 7.7 g/dL (ref 6.5–8.1)

## 2023-01-22 LAB — URINALYSIS, ROUTINE W REFLEX MICROSCOPIC
Bilirubin Urine: NEGATIVE
Glucose, UA: NEGATIVE mg/dL
Ketones, ur: NEGATIVE mg/dL
Nitrite: NEGATIVE
Protein, ur: 30 mg/dL — AB
Specific Gravity, Urine: 1.02 (ref 1.005–1.030)
pH: 7 (ref 5.0–8.0)

## 2023-01-22 LAB — POC URINE PREG, ED: Preg Test, Ur: NEGATIVE

## 2023-01-22 LAB — CBC
HCT: 39.5 % (ref 36.0–46.0)
Hemoglobin: 12.5 g/dL (ref 12.0–15.0)
MCH: 27.6 pg (ref 26.0–34.0)
MCHC: 31.6 g/dL (ref 30.0–36.0)
MCV: 87.2 fL (ref 80.0–100.0)
Platelets: 242 10*3/uL (ref 150–400)
RBC: 4.53 MIL/uL (ref 3.87–5.11)
RDW: 12.2 % (ref 11.5–15.5)
WBC: 5.3 10*3/uL (ref 4.0–10.5)
nRBC: 0 % (ref 0.0–0.2)

## 2023-01-22 LAB — LIPASE, BLOOD: Lipase: 27 U/L (ref 11–51)

## 2023-01-22 MED ORDER — CEFDINIR 300 MG PO CAPS: 300.0000 mg | ORAL_CAPSULE | Freq: Two times a day (BID) | ORAL | 0 refills | Status: DC

## 2023-01-22 NOTE — ED Provider Notes (Signed)
[facility] Provider Note    Event Date/Time   First MD Initiated Contact with Patient 01/22/23 1006     (approximate)   History   Dysuria and Abdominal Pain   HPI  Samantha Reyes is a 22 y.o. female with history of anemia, back pain presents emergency department with hematuria and pubic pain.  No vaginal discharge.  No fever or chills.  Patient states pain for 2 days but noticed some burning with urination along with blood overnight.  Tried to go to [facility] but they said they could not help her.  She denies vomiting.      Physical Exam   Triage Vital Signs: ED Triage Vitals  Encounter Vitals Group     BP 01/22/23 0853 (!) 127/95     Systolic BP Percentile --      Diastolic BP Percentile --      Pulse Rate 01/22/23 0853 (!) 45     Resp 01/22/23 0853 16     Temp 01/22/23 0853 (!) 97.5 F (36.4 C)     Temp Source 01/22/23 0853 Oral     SpO2 01/22/23 0853 97 %     Weight 01/22/23 0856 112 lb (50.8 kg)     Height 01/22/23 0856 5\' 4"  (1.626 m)     Head Circumference --      Peak Flow --      Pain Score 01/22/23 0856 5     Pain Loc --      Pain Education --      Exclude from Growth Chart --     Most recent vital signs: Vitals:   01/22/23 0853  BP: (!) 127/95  Pulse: (!) 45  Resp: 16  Temp: (!) 97.5 F (36.4 C)  SpO2: 97%     General: Awake, no distress.   CV:  Good peripheral perfusion. regular rate and  rhythm Resp:  Normal effort. Lungs cta Abd:  No distention.  Nontender, bowel sounds normal, no CVA tenderness Other:      ED Results / Procedures / Treatments   Labs (all labs ordered are listed, but only abnormal results are displayed) Labs Reviewed  COMPREHENSIVE METABOLIC PANEL - Abnormal; Notable for the following components:      Result Value   Creatinine, Ser 0.43 (*)    All other components within normal limits  URINALYSIS, ROUTINE W REFLEX MICROSCOPIC - Abnormal; Notable for the following components:   Color,  Urine YELLOW (*)    APPearance HAZY (*)    Hgb urine dipstick MODERATE (*)    Protein, ur 30 (*)    Leukocytes,Ua SMALL (*)    Bacteria, UA MANY (*)    All other components within normal limits  URINE CULTURE  LIPASE, BLOOD  CBC  POC URINE PREG, ED     EKG     RADIOLOGY     PROCEDURES:   Procedures   MEDICATIONS ORDERED IN ED: Medications - No data to display   IMPRESSION / MDM / ASSESSMENT AND PLAN / ED COURSE  I reviewed the triage vital signs and the nursing notes.                              Differential diagnosis includes, but is not limited to, kidney stone, UTI, pyelonephritis, hematuria  Patient's presentation is most consistent with acute illness / injury with system symptoms.   POC pregnancy negative, urinalysis does show moderate  amount of hemoglobin, small amount of leuks, and many bacteria.  Remainder of her labs are reassuring  I did explain these findings to the patient.  I did consider that she could have a kidney stone but at this time I feel this is more of a basic UTI.  Do not feel that additional imaging or workup is warranted at this time.  She does not qualify for admission.  She was given a prescription for Omnicef.  She is to follow-up with her regular doctor if not improving in 3 to 4 days.  Return emergency department if worsening.  Patient is in agreement treatment plan.  I did add a urine culture.  She was discharged stable condition.      FINAL CLINICAL IMPRESSION(S) / ED DIAGNOSES   Final diagnoses:  Acute UTI     Rx / DC Orders   ED Discharge Orders          Ordered    cefdinir (OMNICEF) 300 MG capsule  2 times daily        01/22/23 1102             Note:  This document was prepared using Dragon voice recognition software and may include unintentional dictation errors.    Faythe Ghee, PA-C 01/22/23 1133    Sharman Cheek, MD 01/23/23 [phone]

## 2023-01-22 NOTE — ED Notes (Signed)
See triage note  Presents with some lower abd pain and pressure with urination  States she also noticed a small amt of with urination

## 2023-01-22 NOTE — ED Triage Notes (Signed)
Pt to ED via POV for dysuria and hematuria. Pt states that she went to [facility] yesterday but they would not see her because she had a baby in November and did not follow up with her OBGYN. Pt states that she has pain all the time in her lower abdomen. Pt denies fever or chills. Pt is in NAD.

## 2023-01-22 NOTE — Discharge Instructions (Signed)
Follow-up with your regular doctor if not improving in 3 to 4 days.  Return emergency department if worsening.  Take medication as prescribed

## 2023-01-24 LAB — URINE CULTURE: Culture: >=100000 — AB

## 2023-09-14 ENCOUNTER — Telehealth: Admitting: Physician Assistant

## 2023-09-14 DIAGNOSIS — J069 Acute upper respiratory infection, unspecified: Secondary | ICD-10-CM | POA: Diagnosis not present

## 2023-09-14 DIAGNOSIS — J4521 Mild intermittent asthma with (acute) exacerbation: Secondary | ICD-10-CM

## 2023-09-14 MED ORDER — PREDNISONE 20 MG PO TABS: 40.0000 mg | ORAL_TABLET | Freq: Every day | ORAL | 0 refills | Status: AC

## 2023-09-14 NOTE — Progress Notes (Signed)
 Virtual Visit Consent   Samantha Reyes, you are scheduled for a virtual visit with a [facility] provider today. Just as with appointments in the office, your consent must be obtained to participate. Your consent will be active for this visit and any virtual visit you may have with one of our providers in the next 365 days. If you have a MyChart account, a copy of this consent can be sent to you electronically.  As this is a virtual visit, video technology does not allow for your provider to perform a traditional examination. This may limit your provider's ability to fully assess your condition. If your provider identifies any concerns that need to be evaluated in person or the need to arrange testing (such as labs, EKG, etc.), we will make arrangements to do so. Although advances in technology are sophisticated, we cannot ensure that it will always work on either your end or our end. If the connection with a video visit is poor, the visit may have to be switched to a telephone visit. With either a video or telephone visit, we are not always able to ensure that we have a secure connection.  By engaging in this virtual visit, you consent to the provision of healthcare and authorize for your insurance to be billed (if applicable) for the services provided during this visit. Depending on your insurance coverage, you may receive a charge related to this service.  I need to obtain your verbal consent now. Are you willing to proceed with your visit today? Samantha Reyes has provided verbal consent on 09/14/2023 for a virtual visit (video or telephone). Samantha Reyes, [place]  Date: 09/14/2023 9:45 AM   Virtual Visit via Video Note   I, Samantha Reyes, connected with  Samantha Reyes  ([id number], [birth year]) on 09/14/23 at  9:00 AM EDT by a video-enabled telemedicine application and verified that I am speaking with the correct person using two identifiers.  Location: Patient: Virtual Visit  Location Patient: Home Provider: Virtual Visit Location Provider: Home Office   I discussed the limitations of evaluation and management by telemedicine and the availability of in person appointments. The patient expressed understanding and agreed to proceed.    History of Present Illness: Samantha Reyes is a 23 y.o. who identifies as a female who was assigned female at birth, and is being seen today for 2 to 3 days of sore throat with substantial odynophagia.  Has noted some associated chest tightness and windedness, exertional only.  Denies fever, chills or aches.  Has noted some flushing a couple of times.  Also some associated right ear pressure and sinus pressure.  Denies ear pain or sinus pain.   Denies current pregnancy and is not breast-feeding.  HPI: HPI  Problems:  Patient Active Problem List   Diagnosis Date Noted   Post term pregnancy over 40 weeks 03/06/2022   Noncompliance with prenatal care (no care x 11 wks) 02/06/2022   Pica ice 02/06/2022   Anemia in pregnancy 01/15/2022   Maternal varicella, non-immune 10/09/2021   Late prenatal care 18 2/7 10/03/2021   Supervision of normal intrauterine pregnancy in multigravida in third trimester 10/03/2021   Fetal demise before 20 weeks with retention of placenta 06/15/19 and D&C 10/03/2021   Depression affecting pregnancy 10/03/2021    Allergies:  Allergies  Allergen Reactions   Shellfish Allergy Anaphylaxis   Medications:  Current Outpatient Medications:    ferrous sulfate  325 (65 FE) MG tablet, Take 1 tablet (  325 mg total) by mouth 3 (three) times daily with meals., Disp: 100 tablet, Rfl: 3  Observations/Objective: Patient is well-developed, well-nourished in no acute distress.  Resting comfortably  at home.  Head is normocephalic, atraumatic.  No labored breathing.  Speech is clear and coherent with logical content.  Patient is alert and oriented at baseline.   Assessment and Plan: 1. Viral URI (Primary)  2.  Mild intermittent reactive airway disease with acute exacerbation  Supportive measures and OTC medications reviewed.  Giving reactive airway will start prednisone  40 mg daily for 5 days.  Follow-up in person for any nonresolving, new or worsening symptoms despite treatment.  Work note provided.  Follow Up Instructions: I discussed the assessment and treatment plan with the patient. The patient was provided an opportunity to ask questions and all were answered. The patient agreed with the plan and demonstrated an understanding of the instructions.  A copy of instructions were sent to the patient via MyChart unless otherwise noted below.    The patient was advised to call back or seek an in-person evaluation if the symptoms worsen or if the condition fails to improve as anticipated.    Samantha Maillard, PA-C

## 2023-09-14 NOTE — Patient Instructions (Signed)
  Samantha Reyes, thank you for joining Hyla Maillard, PA-C for today's virtual visit.  While this provider is not your primary care provider (PCP), if your PCP is located in our provider database this encounter information will be shared with them immediately following your visit.   A [facility] MyChart account gives you access to today's visit and all your visits, tests, and labs performed at [facility] " click here if you don't have a [facility] MyChart account or go to [email or web address]  Consent: (Patient) Samantha Reyes provided verbal consent for this virtual visit at the beginning of the encounter.  Current Medications:  Current Outpatient Medications:    ferrous sulfate  325 (65 FE) MG tablet, Take 1 tablet (325 mg total) by mouth 3 (three) times daily with meals., Disp: 100 tablet, Rfl: 3   Medications ordered in this encounter:  No orders of the defined types were placed in this encounter.    *If you need refills on other medications prior to your next appointment, please contact your pharmacy*  Follow-Up: Call back or seek an in-person evaluation if the symptoms worsen or if the condition fails to improve as anticipated.  [facility] [phone]  Other Instructions Please continue to hydrate and rest. Start an over-the-counter nondrowsy antihistamine like Claritin or Xyzal once daily. You can use Delsym over-the-counter for cough. Tylenol  for throat pain. Take the prednisone  as directed.  This will help reduce throat inflammation and relax your airways. Follow-up in person for any nonresolving, new or worsening symptoms despite treatment.   If you have been instructed to have an in-person evaluation today at a local Urgent Care facility, please use the link below. It will take you to a list of all of our available [facility], including address, phone number and hours of operation. Please do not delay care.   [facility]  If you or a family member do not have a primary care provider, use the link below to schedule a visit and establish care. When you choose a [facility] primary care physician or advanced practice provider, you gain a long-term partner in health. Find a Primary Care Provider  Learn more about [facility] in-office and virtual care options: [facility] - Get Care Now

## 2024-04-04 ENCOUNTER — Ambulatory Visit: Admitting: Family Medicine

## 2024-04-04 ENCOUNTER — Encounter: Payer: Self-pay | Admitting: Family Medicine

## 2024-04-04 DIAGNOSIS — Z113 Encounter for screening for infections with a predominantly sexual mode of transmission: Secondary | ICD-10-CM

## 2024-04-04 DIAGNOSIS — R4586 Emotional lability: Secondary | ICD-10-CM

## 2024-04-04 LAB — WET PREP FOR TRICH, YEAST, CLUE
Clue Cell Exam: NEGATIVE
Trichomonas Exam: NEGATIVE
Yeast Exam: NEGATIVE

## 2024-04-04 LAB — HM HIV SCREENING LAB: HM HIV Screening: NEGATIVE

## 2024-04-04 NOTE — Progress Notes (Signed)
 [facility] STI clinic [address] [place] [address] Main phone: [phone]  STI screening visit  Subjective:  Samantha Reyes is a 23 y.o. female being seen today for an STI screening visit. The patient reports they do have symptoms.    Patient has the following medical conditions:  Patient Active Problem List   Diagnosis Date Noted   Post term pregnancy over 40 weeks 03/06/2022   Noncompliance with prenatal care (no care x 11 wks) 02/06/2022   Pica ice 02/06/2022   Anemia in pregnancy 01/15/2022   Maternal varicella, non-immune 10/09/2021   Late prenatal care 18 2/7 10/03/2021   Supervision of normal intrauterine pregnancy in multigravida in third trimester 10/03/2021   Fetal demise before 20 weeks with retention of placenta 06/15/19 and D&C 10/03/2021   Depression affecting pregnancy 10/03/2021   Chief Complaint  Patient presents with   SEXUALLY TRANSMITTED DISEASE    HPI Patient reports she is here for routine STI testing.  She endorses white-sticky discharge that is malodorous that started end of October.  She wen't to the ER May 2025 and was given abx, per pt they didn't find antyhing. She denies vaginal itching or burning with urination. Has been experiencing intermittent abdominal pain. Pt reports she has had bladder issues in the past.  Reproductive considerations Patient reports they are not pregnant. They desire a pregnancy in the next year. Patient is currently using no method - she wants to get pregnant ~ after August 2026. They reported they are not interested in discussing contraception today.    Patient's last menstrual period was 03/16/2024 (exact date).  Does the patient using douching products? No  Patient's routine cervical screening is overdue. Due before 2002/06/12  See flowsheet for further details and programmatic requirements Hyperlink available at the top of the signed note in blue.  Flow sheet  content below:  Pregnancy Intention Screening Does the patient want to become pregnant in the next year?: Unsure Does the patient's partner want to become pregnant in the next year?: Unsure Would the patient like to discuss contraceptive options today?: No All Patients Anyone smoke around pt and/or pt's children?: No Anyone smoke inside pt's house?: No Anyone smoke inside car?: No Anyone smoke inside the workplace?: No Reason For STD Screen STD Screening: Has symptoms Have you ever had an STD?: No History of Antibiotic use in the past 2 weeks?: No STD Symptoms Denies all: No Discharge: Yes Risk Factors for Hep B Household, sexual, or needle sharing contact of a person infected with Hep B: No Sexual contact with a person who uses drugs not as prescribed?: Yes Currently or Ever used drugs not as prescribed: No HIV Positive: No PRep Patient: No Men who have sex with men: No Have Hepatitis C: No History of Incarceration: No History of Homeslessness?: No Anal sex following anal drug use?: No Risk Factors for Hep C Currently using drugs not as prescribed: No Sexual partner(s) currently using drugs as not prescribed: No History of drug use: No HIV Positive: No People with a history of incarceration: No People born between the years of [birth year] and 1965: No Hepatitis Counseling Hep B Counseling: Counseled patient about increased risk of Hep B and recommendation for testing, Patient declines testing for Hep B today Abuse History Has patient ever been abused physically?: No Has patient ever been abused sexually?: No Does patient feel they have a problem with Anxiety?: Yes Does patient feel they have a problem with  Depression?: Yes Referral to Behavioral Health: Yes Counseling Patient counseled to use condoms with all sex: Condoms declined RTC in 2-3 weeks for test results: Yes Clinic will call if test results abnormal before test result appt.: Yes Test results given to  patient Patient counseled to use condoms with all sex: Condoms declined   Screening for MPX risk:  Unexplained rash?  No   MSM?  No   Multiple or anonymous sex partners?  No   Any close or sexual contact with a person  diagnosed with MPX?  No   Any outside the [place]  where MPX is endemic?  No   High clinical suspicion for MPX?    -Unlikely to be chickenpox    -Lymphadenopathy    -Rash that presents in same phase of       evolution on any given body part  No   Does this patient meet CDC recommendations for vaccination against MPOX? No  You already have or anticipate having the following risks:  Your sex partner has the following risks: You're traveling to a county with a clade I MPOX outbreak and anticipate these risks: Occupational exposure  You had known or suspected exposure to someone with monkeypox You had a sex partner in the past 2 weeks who was diagnosed with monkeypox You are a gay, bisexual, or other man who has sex with men, or are transgender or nonbinary and in the past 6 months have had any of the following: - A new diagnosis of one or more sexually transmitted diseases (e.g., chlamydia, gonorrhea, or syphilis) - More than one sex partner You have had any of the following in the past 6 months: - Sex at a commercial sex venue (like a sex club or bathhouse) - Sex related to a large commercial event   or in a geographic area (city or county for example) where mpox virus transmission is occurring Sex with a new partner Sex at a commercial sex venue (e.g., a sex club or bathhouse) Sex in it consultant for money, goods, drugs, or other trade Sex in association with a large public event (e.g., a rave, party, or festival) i.e. certain people who work in a laboratory or healthcare facility   Infectious disease screenings: Vaccinated against HPV? Unknown  HIV Ever had a positive? No Last test: N/A Results in chart:  Lab Results  Component Value Date   HMHIVSCREEN Negative -  Validated 09/09/2019   No results found for: HIV   Hep B Hep B status: unknown or no prior testing Received HBV vaccination? Unknown Received HBV testing for immunity? Unknown Results in chart:  No components found for: HMHEPBSCREEN  Do they qualify for HBV screening today? but politely declines testing today Criteria:  -Household, sexual or needle sharing contact with HBV -History of drug use or homelessness -HIV positive -Those with known Hep C  Hep C Hep C status: unknown or no prior testing Results in chart:  Lab Results  Component Value Date   HMHEPCSCREEN Negative-Validated 09/09/2019   No components found for: HEPC  Do they qualify for HCV screening today? but politely declines testing today Criteria - since the last HCV result, does the patient have any of the following? - Current drug use - Have a partner with drug use - Has been incarcerated  Immunization history:  Immunization History  Administered Date(s) Administered   Tdap 01/15/2022    The following portions of the patient's history were reviewed and updated as appropriate: allergies, current medications, past medical  history, past social history, past surgical history and problem list.  Substance use screenings:  Uses tobacco products? No Uses vapes? No Uses alcohol? No Uses non-injectable substances that alter your mental status? No Uses non-prescribed injectable substances? No  Objective:  There were no vitals filed for this visit.  Physical Exam Vitals and nursing note reviewed. Exam conducted with a chaperone present Brett Orange, CMA).  Constitutional:      Appearance: Normal appearance. She is normal weight.  HENT:     Head: Normocephalic.     Mouth/Throat:     Mouth: Mucous membranes are moist.     Pharynx: Oropharynx is clear. No oropharyngeal exudate or posterior oropharyngeal erythema.  Eyes:     Conjunctiva/sclera: Conjunctivae normal.     Pupils: Pupils are equal, round, and  reactive to light.  Cardiovascular:     Rate and Rhythm: Normal rate.     Pulses: Normal pulses.  Pulmonary:     Effort: Pulmonary effort is normal.  Abdominal:     General: Abdomen is flat.     Tenderness: There is abdominal tenderness in the left lower quadrant.  Genitourinary:    General: Normal vulva.     Exam position: Lithotomy position.     Pubic Area: No rash or pubic lice.      Labia:        Right: No rash, tenderness or lesion.        Left: No rash, tenderness or lesion.      Vagina: Normal. No vaginal discharge or erythema.     Cervix: Normal.  Musculoskeletal:        General: Normal range of motion.     Cervical back: Normal range of motion.  Lymphadenopathy:     Lower Body: No right inguinal adenopathy. No left inguinal adenopathy.  Skin:    General: Skin is warm.     Findings: No bruising, erythema, lesion or rash.  Neurological:     General: No focal deficit present.     Mental Status: She is alert and oriented to person, place, and time. Mental status is at baseline.  Psychiatric:        Mood and Affect: Mood normal.        Behavior: Behavior normal.        Thought Content: Thought content normal.    Assessment and Plan:  VANGIE HENTHORN is a 23 y.o. female presenting to the [facility] for STI screening.  Patient accepted the following screenings: vaginal CT/GC swab, vaginal wet prep, HIV, and RPR   1. Screening for venereal disease (Primary) -Routine STI testing conducted.  -Pt does not have established PCP, discussed Carlin Blamer [facility] for further evaluation and work-up of UTI symptoms.  Given precautionary measures to report to ED if symptoms persists or worsen prior to setting up appt with Carlin Blamer. -Discussed preventative health measures with pt to include conducting  pap smear prior to 06/27/24, pt aware and will schedule annual well-woman exam accordingly.   - HIV [facility] - Chlamydia/Gonorrhea [facility]  Lab - Syphilis Serology,  Lab - WET PREP FOR TRICH, YEAST, CLUE  2. Mood changes -Accepted [facility] Counseling, referral placed 04/04/24 - Ambulatory referral to Behavioral Health   Counseling: Discussed time line for [facility] Lab results and that patient will be called with positive results and encouraged patient to call if they had not heard in 2 weeks.  Counseled to return or seek care for continued or  worsening symptoms Recommended repeat testing in 3 months with positive results. Recommended condom use with all sex for STI prevention.   Return for annual well-woman exam.  No future appointments.  Hardin GORMAN Pouch, NP

## 2024-04-06 ENCOUNTER — Ambulatory Visit: Payer: Self-pay

## 2024-04-06 NOTE — Progress Notes (Signed)
 Wet prep reviewed, negative. No further action required.  Hardin Pouch, NP
